# Patient Record
Sex: Male | Born: 1960 | Race: White | Hispanic: No | Marital: Married
Health system: Southern US, Community
[De-identification: ages and names within clinical notes are randomized; demographics above are authoritative.]

---

## 1998-01-17 ENCOUNTER — Ambulatory Visit (HOSPITAL_COMMUNITY): Admission: RE | Admit: 1998-01-17 | Discharge: 1998-01-17 | Payer: Self-pay | Admitting: *Deleted

## 2009-04-20 ENCOUNTER — Ambulatory Visit (HOSPITAL_COMMUNITY): Admission: RE | Admit: 2009-04-20 | Discharge: 2009-04-20 | Payer: Self-pay | Admitting: Sports Medicine

## 2021-03-22 ENCOUNTER — Other Ambulatory Visit: Payer: Self-pay | Admitting: Family Medicine

## 2021-03-22 DIAGNOSIS — E7800 Pure hypercholesterolemia, unspecified: Secondary | ICD-10-CM

## 2021-04-20 ENCOUNTER — Ambulatory Visit
Admission: RE | Admit: 2021-04-20 | Discharge: 2021-04-20 | Disposition: A | Discharge status: Home / Self Care | Payer: Self-pay | Source: Ambulatory Visit | Attending: Family Medicine | Admitting: Family Medicine

## 2021-04-20 DIAGNOSIS — E7800 Pure hypercholesterolemia, unspecified: Secondary | ICD-10-CM

## 2022-05-17 IMAGING — CT CT CARDIAC CORONARY ARTERY CALCIUM SCORE
3 series · 14 of 20 positions shown, 16 images · non-contrast
Comparison: None.

CLINICAL DATA: 60-year-old white male with elevated LDL cholesterol
level.

EXAM:
CT CARDIAC CORONARY ARTERY CALCIUM SCORE
TECHNIQUE: Non-contrast imaging through the heart was performed using
prospective ECG gating. Image post processing was performed on an
independent workstation, allowing for quantitative analysis of the
heart and coronary arteries. Note that this exam targets the heart
and the chest was not imaged in its entirety.

[Series 2: calcium scoring 2.00 qr36 bestdiast 71% hrt calciu · axial · 0.38mm/px · z∈[+1720,+1798]mm · 4 of 67 slices shown]
[im 14/67  vessel]
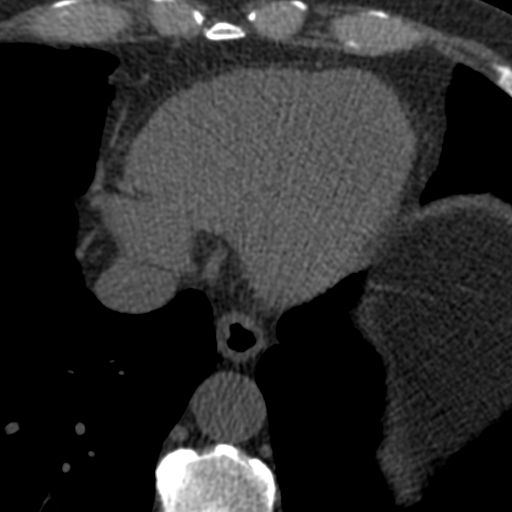
[im 27/67  vessel]
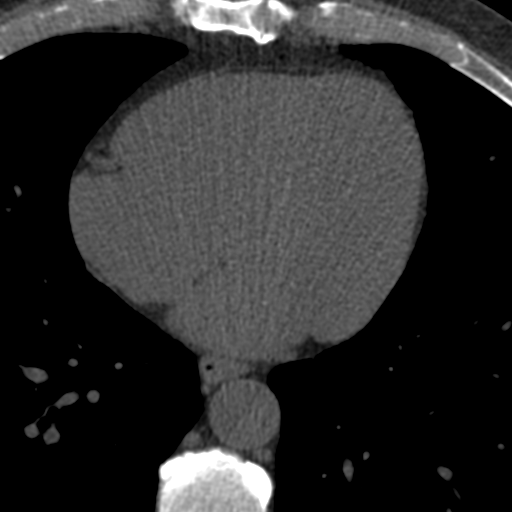
[im 40/67  vessel]
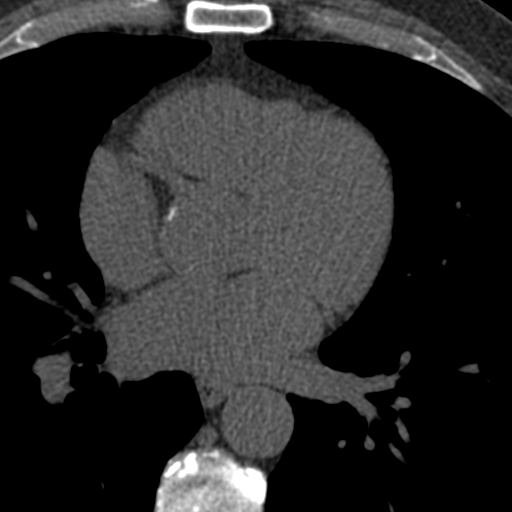
[im 53/67  vessel]
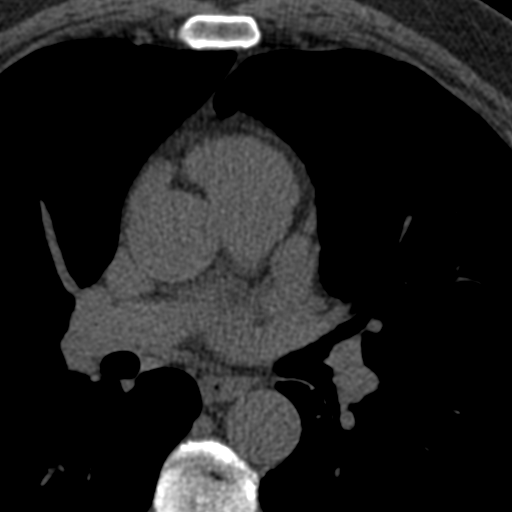

[Series 3: calcium scoring 2.00 br40 bestdiast 71% axial · axial · 0.57mm/px · z∈[+1716,+1804]mm · 5 of 67 slices shown, 7 images]
[im 12/67  vessel]
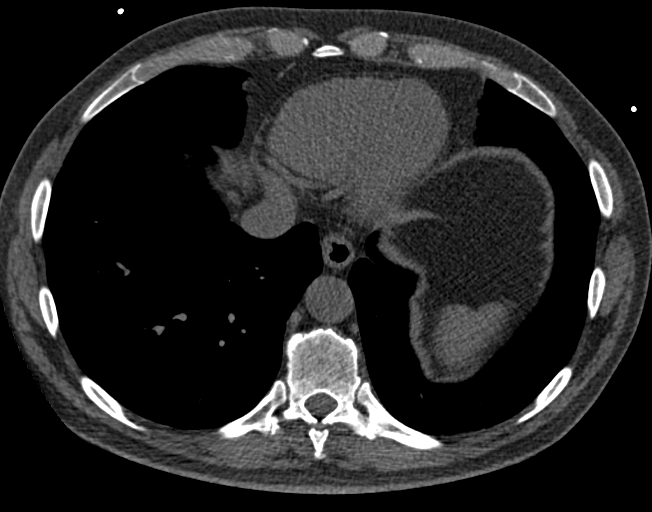
[im 12/67  lung]
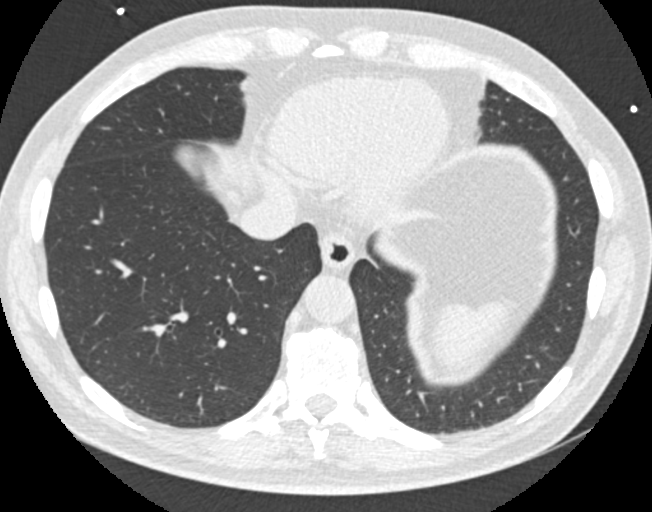
[im 23/67  vessel]
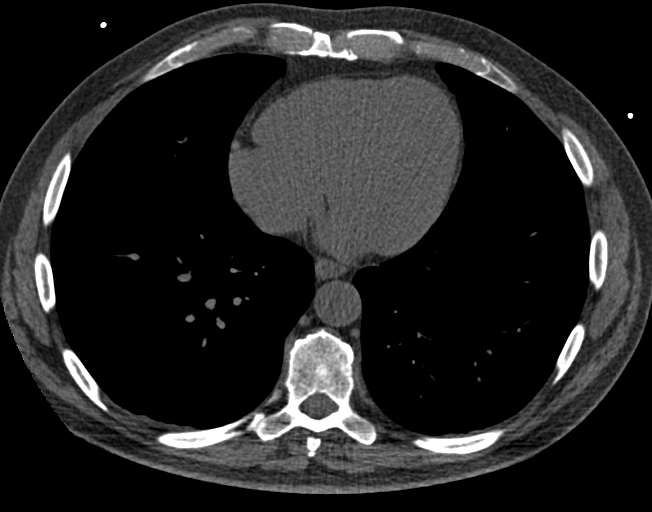
[im 34/67  vessel]
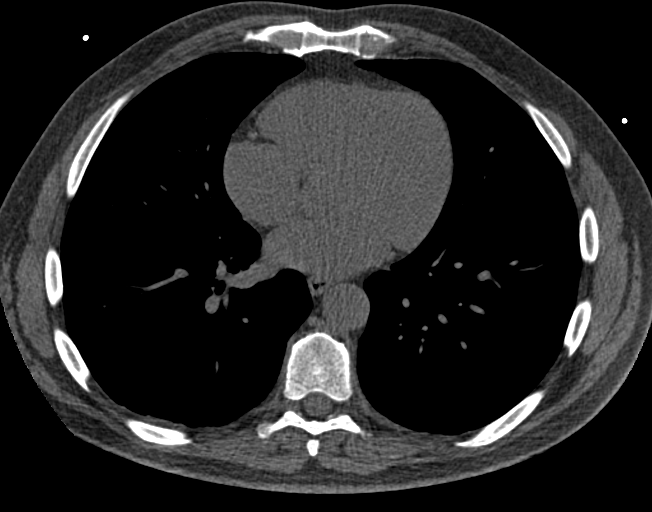
[im 45/67  vessel]
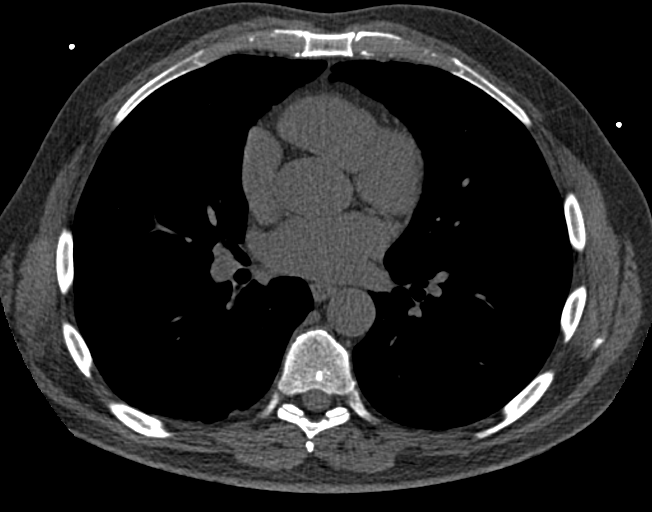
[im 56/67  vessel]
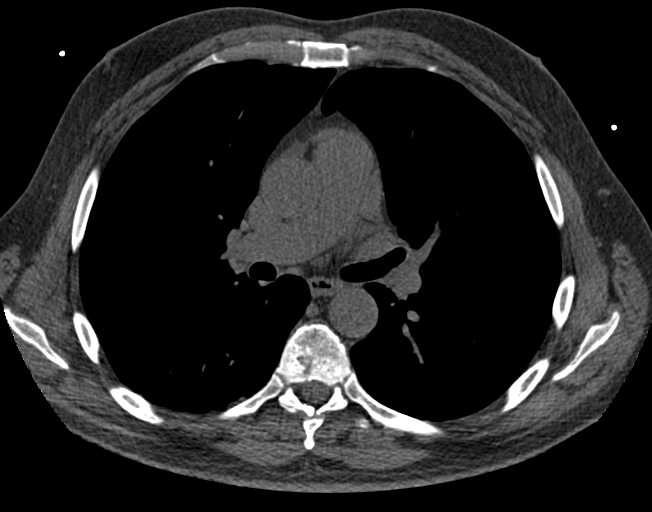
[im 56/67  lung]
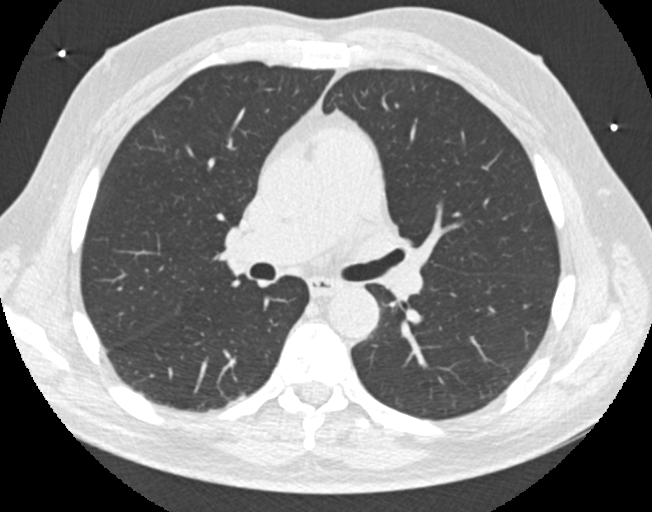

[Series 9: calcium scoring 2.00 br60 bestdiast 71% lungs · axial · 0.57mm/px · z∈[+1716,+1804]mm · 5 of 67 slices shown]
[im 12/67  vessel]
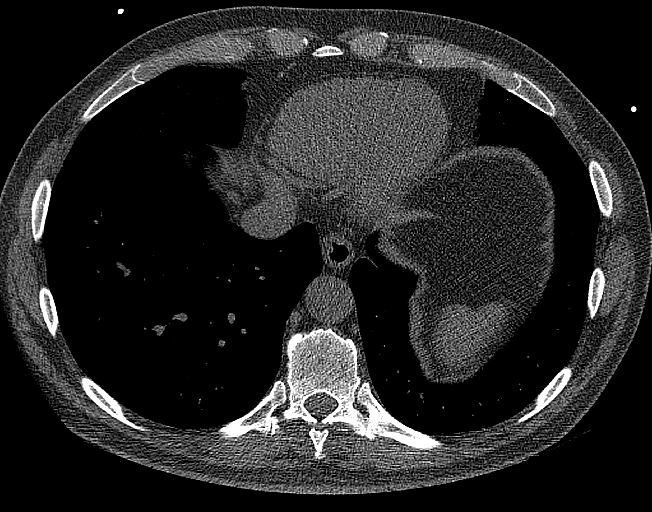
[im 23/67  vessel]
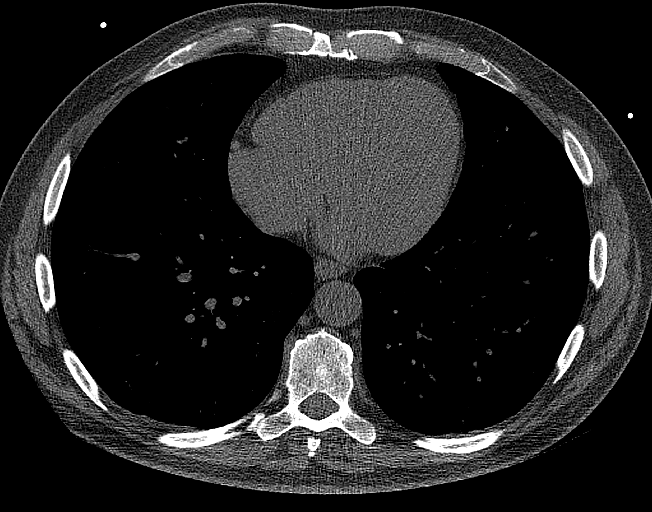
[im 34/67  vessel]
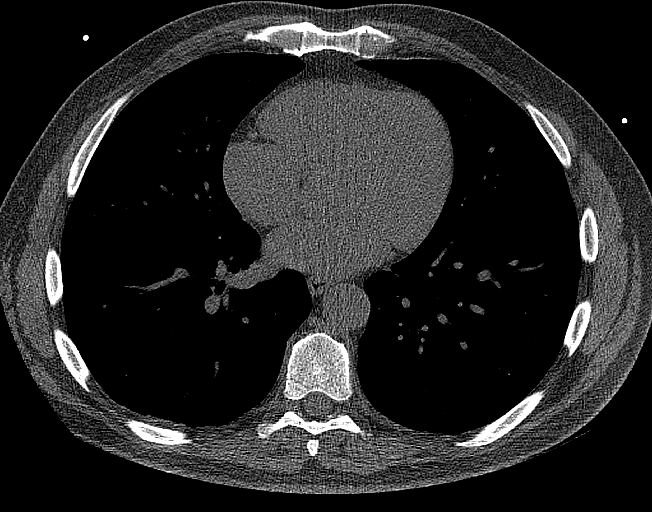
[im 45/67  vessel]
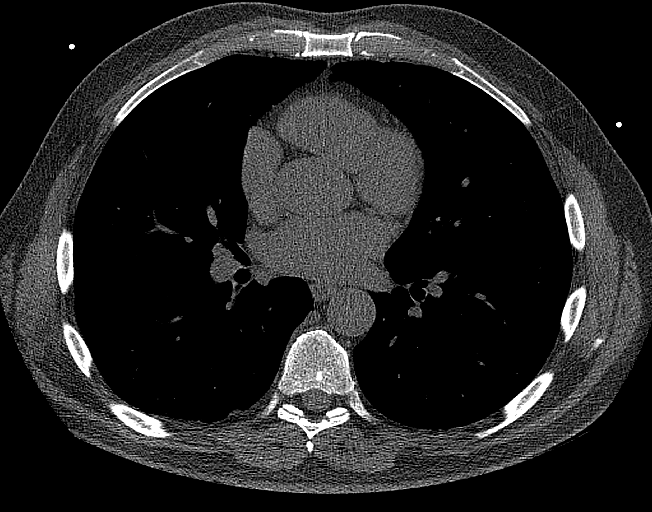
[im 56/67  vessel]
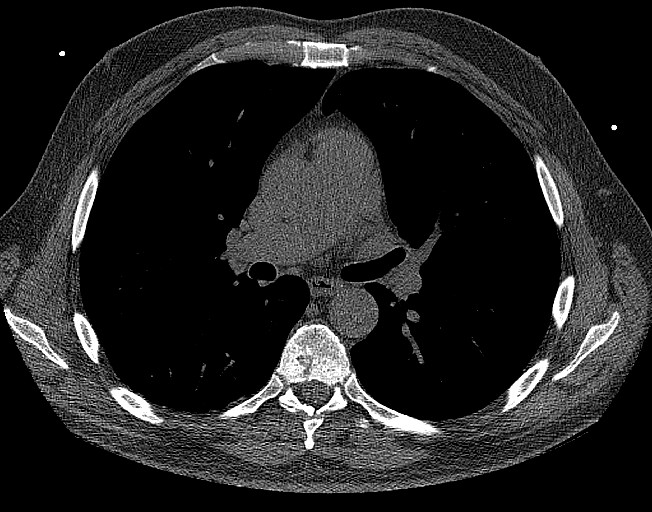

[14 of 20 positions shown; findings below may reference images not displayed]

FINDINGS: CORONARY CALCIUM SCORES:

Left Main: 0

LAD:

LCx:

RCA:

Total Agatston Score:

[HOSPITAL] percentile: 57

AORTA MEASUREMENTS:

Ascending Aorta: 34 mm

Descending Aorta: 29 mm

OTHER FINDINGS:

Heart size normal. No significant pericardial fluid. Images of the
upper abdomen are unremarkable. Visualized mediastinal structures
are normal. 3 mm triangular shaped density along the right major
fissure on sequence 9 image 25 is most compatible with a
perifissural lymph node. Otherwise, the visualized lungs are clear.
No acute bony abnormality.
IMPRESSION: Coronary calcium score is 61.7 and this is at percentile 57 for
patients of the same age, gender and ethnicity.

## 2023-01-16 ENCOUNTER — Ambulatory Visit: Payer: Self-pay | Admitting: Surgery

## 2023-01-16 NOTE — H&P (Signed)
Subjective    Chief Complaint: New Consultation (Right inguinal hernia)       History of Present Illness: Stephen Skinner is a 62 y.o. male who is seen today as an office consultation at the request of Dr. Azucena Cecil for evaluation of New Consultation (Right inguinal hernia) .     This is a healthy 62 year old male who presents with a 80-month history of some discomfort in his right groin.  The patient had become constipated and severe straining.  He started feeling some pain in his groin.  He is quite active.  He was chopping some wood and began noticing more discomfort and swelling in his right groin.  This area remains reducible.  He continues to have regular bowel movements with no obstructive symptoms.  No imaging.  He was examined by Dr. Erling Conte who felt that he had a right inguinal hernia.  No sign of left inguinal symptoms.  He presents now to discuss hernia repair.     Review of Systems: A complete review of systems was obtained from the patient.  I have reviewed this information and discussed as appropriate with the patient.  See HPI as well for other ROS.   Review of Systems  Constitutional: Negative.   HENT: Negative.    Eyes: Negative.   Respiratory: Negative.    Cardiovascular: Negative.   Gastrointestinal: Negative.   Genitourinary: Negative.   Musculoskeletal: Negative.   Skin: Negative.   Neurological: Negative.   Endo/Heme/Allergies: Negative.   Psychiatric/Behavioral: Negative.          Medical History: Past Medical History      Past Medical History:  Diagnosis Date   Anxiety           Problem List  There is no problem list on file for this patient.      Past Surgical History       Past Surgical History:  Procedure Laterality Date   KNEE SURGERY            Allergies  No Known Allergies     Medications Ordered Prior to Encounter  No current outpatient medications on file prior to visit.    No current facility-administered medications on file prior  to visit.        Family History       Family History  Problem Relation Age of Onset   Coronary Artery Disease (Blocked arteries around heart) Mother          Tobacco Use History  Social History       Tobacco Use  Smoking Status Never  Smokeless Tobacco Never        Social History  Social History        Socioeconomic History   Marital status: Married  Tobacco Use   Smoking status: Never   Smokeless tobacco: Never  Vaping Use   Vaping status: Never Used  Substance and Sexual Activity   Alcohol use: Not Currently   Drug use: Never        Objective:         Vitals:    01/16/23 1524  BP: (!) 152/74  Pulse: 67  Temp: 36.8 C (98.3 F)  SpO2: 93%  Weight: 79.9 kg (176 lb 3.2 oz)  Height: 180.3 cm (5\' 11" )  PainSc: 0-No pain    Body mass index is 24.57 kg/m.   Physical Exam    Constitutional:  WDWN in NAD, conversant, no obvious deformities; lying in bed comfortably Eyes:  Pupils equal, round; sclera  anicteric; moist conjunctiva; no lid lag HENT:  Oral mucosa moist; good dentition  Neck:  No masses palpated, trachea midline; no thyromegaly Lungs:  CTA bilaterally; normal respiratory effort CV:  Regular rate and rhythm; no murmurs; extremities well-perfused with no edema Abd:  +bowel sounds, soft, non-tender, no palpable organomegaly; no palpable hernias GU: Bilateral descended testes, no testicular masses, visible reducible right inguinal hernia, no sign of left inguinal hernia.  The patient is wearing a hernia belt to reduce the right inguinal hernia. Musc: Normal  gait; no apparent clubbing or cyanosis in extremities Lymphatic:  No palpable cervical or axillary lymphadenopathy Skin:  Warm, dry; no sign of jaundice Psychiatric - alert and oriented x 4; calm mood and affect   Assessment and Plan:  Diagnoses and all orders for this visit:   Non-recurrent unilateral inguinal hernia without obstruction or gangrene       Right inguinal hernia repair  with mesh.The surgical procedure has been discussed with the patient.  Potential risks, benefits, alternative treatments, and expected outcomes have been explained.  All of the patient's questions at this time have been answered.  The likelihood of reaching the patient's treatment goal is good.  The patient understand the proposed surgical procedure and wishes to proceed.       Lissa Morales, MD  01/16/2023 5:51 PM

## 2023-03-06 ENCOUNTER — Other Ambulatory Visit (HOSPITAL_COMMUNITY): Payer: Self-pay | Admitting: Family Medicine

## 2023-03-06 DIAGNOSIS — E7800 Pure hypercholesterolemia, unspecified: Secondary | ICD-10-CM

## 2023-03-18 ENCOUNTER — Ambulatory Visit (HOSPITAL_COMMUNITY)
Admission: RE | Admit: 2023-03-18 | Discharge: 2023-03-18 | Disposition: A | Discharge status: Home / Self Care | Payer: Self-pay | Source: Ambulatory Visit | Attending: Family Medicine | Admitting: Family Medicine

## 2023-03-18 DIAGNOSIS — E7800 Pure hypercholesterolemia, unspecified: Secondary | ICD-10-CM | POA: Insufficient documentation

## 2023-05-30 ENCOUNTER — Other Ambulatory Visit: Payer: Self-pay | Admitting: Urology

## 2023-05-30 DIAGNOSIS — R972 Elevated prostate specific antigen [PSA]: Secondary | ICD-10-CM

## 2023-06-18 ENCOUNTER — Ambulatory Visit
Admission: RE | Admit: 2023-06-18 | Discharge: 2023-06-18 | Disposition: A | Discharge status: Home / Self Care | Payer: BC Managed Care – PPO | Source: Ambulatory Visit | Attending: Urology | Admitting: Urology

## 2023-06-18 DIAGNOSIS — R972 Elevated prostate specific antigen [PSA]: Secondary | ICD-10-CM

## 2023-06-18 MED ORDER — GADOPICLENOL 0.5 MMOL/ML IV SOLN
10.0000 mL | Freq: Once | INTRAVENOUS | Status: AC | PRN
  Administered 2023-06-18: 8 mL via INTRAVENOUS

## 2023-07-16 ENCOUNTER — Other Ambulatory Visit: Payer: No Typology Code available for payment source
